# Patient Record
Sex: Male | Born: 2005 | Race: Black or African American | Hispanic: No | Marital: Single | Smoking: Never smoker
Health system: Southern US, Community
[De-identification: ages and names within clinical notes are randomized; demographics above are authoritative.]

## PROBLEM LIST (undated history)

## (undated) DIAGNOSIS — R5600 Simple febrile convulsions: Secondary | ICD-10-CM

## (undated) DIAGNOSIS — Z789 Other specified health status: Secondary | ICD-10-CM

## (undated) HISTORY — PX: ADENOIDECTOMY: SUR15

## (undated) HISTORY — PX: MYRINGOTOMY WITH TUBE PLACEMENT: SHX5663

## (undated) HISTORY — PX: TONSILLECTOMY: SUR1361

---
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
## 2024-01-01 ENCOUNTER — Ambulatory Visit (HOSPITAL_COMMUNITY): Payer: 59 | Admitting: Certified Registered"

## 2024-01-01 ENCOUNTER — Encounter (HOSPITAL_COMMUNITY): Payer: Self-pay | Admitting: Urology

## 2024-01-01 ENCOUNTER — Ambulatory Visit (HOSPITAL_BASED_OUTPATIENT_CLINIC_OR_DEPARTMENT_OTHER): Payer: 59 | Admitting: Certified Registered"

## 2024-01-01 ENCOUNTER — Ambulatory Visit (HOSPITAL_COMMUNITY)
Admission: RE | Admit: 2024-01-01 | Discharge: 2024-01-01 | Disposition: A | Discharge status: Home / Self Care | Payer: 59 | Source: Ambulatory Visit | Attending: Urology | Admitting: Urology

## 2024-01-01 ENCOUNTER — Encounter (HOSPITAL_COMMUNITY)
Admission: RE | Disposition: A | Discharge status: Home / Self Care | Payer: Self-pay | Source: Ambulatory Visit | Attending: Urology

## 2024-01-01 ENCOUNTER — Other Ambulatory Visit: Payer: Self-pay

## 2024-01-01 DIAGNOSIS — N4400 Torsion of testis, unspecified: Secondary | ICD-10-CM

## 2024-01-01 DIAGNOSIS — E66813 Obesity, class 3: Secondary | ICD-10-CM | POA: Diagnosis not present

## 2024-01-01 DIAGNOSIS — Z6838 Body mass index (BMI) 38.0-38.9, adult: Secondary | ICD-10-CM | POA: Diagnosis not present

## 2024-01-01 HISTORY — PX: ORCHIOPEXY: SHX479

## 2024-01-01 SURGERY — ORCHIOPEXY ADULT
Anesthesia: General | Site: Scrotum | Laterality: Bilateral

## 2024-01-01 MED ORDER — SUGAMMADEX SODIUM 200 MG/2ML IV SOLN
INTRAVENOUS | Status: DC | PRN
  Administered 2024-01-01: 280 mg via INTRAVENOUS

## 2024-01-01 MED ORDER — TRAMADOL HCL 50 MG PO TABS: 50.0000 mg | ORAL_TABLET | Freq: Four times a day (QID) | ORAL | 0 refills | Status: AC | PRN

## 2024-01-01 MED ORDER — LIDOCAINE HCL (PF) 2 % IJ SOLN
INTRAMUSCULAR | Status: AC
  Filled 2024-01-01: qty 5

## 2024-01-01 MED ORDER — MIDAZOLAM HCL 2 MG/2ML IJ SOLN
INTRAMUSCULAR | Status: AC
  Filled 2024-01-01: qty 2

## 2024-01-01 MED ORDER — LACTATED RINGERS IV SOLN: INTRAVENOUS | Status: DC

## 2024-01-01 MED ORDER — ORAL CARE MOUTH RINSE: 15.0000 mL | Freq: Once | OROMUCOSAL | Status: AC

## 2024-01-01 MED ORDER — LIDOCAINE 2% (20 MG/ML) 5 ML SYRINGE
INTRAMUSCULAR | Status: DC | PRN
  Administered 2024-01-01: 60 mg via INTRAVENOUS

## 2024-01-01 MED ORDER — FENTANYL CITRATE (PF) 100 MCG/2ML IJ SOLN
INTRAMUSCULAR | Status: AC
  Filled 2024-01-01: qty 2

## 2024-01-01 MED ORDER — LIDOCAINE HCL (PF) 2 % IJ SOLN
INTRAMUSCULAR | Status: AC
Start: 2024-01-01 — End: ?
  Filled 2024-01-01: qty 5

## 2024-01-01 MED ORDER — TRAMADOL HCL 50 MG PO TABS
ORAL_TABLET | ORAL | Status: AC
  Administered 2024-01-01: 50 mg via ORAL
  Filled 2024-01-01: qty 1

## 2024-01-01 MED ORDER — ACETAMINOPHEN 500 MG PO TABS
1000.0000 mg | ORAL_TABLET | Freq: Once | ORAL | Status: AC
  Administered 2024-01-01: 1000 mg via ORAL
  Filled 2024-01-01: qty 2

## 2024-01-01 MED ORDER — DROPERIDOL 2.5 MG/ML IJ SOLN: 0.6250 mg | Freq: Once | INTRAMUSCULAR | Status: DC | PRN

## 2024-01-01 MED ORDER — 0.9 % SODIUM CHLORIDE (POUR BTL) OPTIME
TOPICAL | Status: DC | PRN
  Administered 2024-01-01: 1000 mL

## 2024-01-01 MED ORDER — ONDANSETRON HCL 4 MG/2ML IJ SOLN
INTRAMUSCULAR | Status: DC | PRN
  Administered 2024-01-01: 4 mg via INTRAVENOUS

## 2024-01-01 MED ORDER — MIDAZOLAM HCL 2 MG/2ML IJ SOLN
INTRAMUSCULAR | Status: DC | PRN
  Administered 2024-01-01: 2 mg via INTRAVENOUS

## 2024-01-01 MED ORDER — DEXAMETHASONE SODIUM PHOSPHATE 10 MG/ML IJ SOLN
INTRAMUSCULAR | Status: DC | PRN
  Administered 2024-01-01: 10 mg via INTRAVENOUS

## 2024-01-01 MED ORDER — ROCURONIUM BROMIDE 10 MG/ML (PF) SYRINGE
PREFILLED_SYRINGE | INTRAVENOUS | Status: DC | PRN
  Administered 2024-01-01: 70 mg via INTRAVENOUS

## 2024-01-01 MED ORDER — SODIUM CHLORIDE 0.9 % IV SOLN
3.0000 g | INTRAVENOUS | Status: AC
  Administered 2024-01-01: 3 g via INTRAVENOUS
  Filled 2024-01-01: qty 3

## 2024-01-01 MED ORDER — MEPERIDINE HCL 50 MG/ML IJ SOLN: 6.2500 mg | INTRAMUSCULAR | Status: DC | PRN

## 2024-01-01 MED ORDER — TRAMADOL HCL 50 MG PO TABS: 50.0000 mg | ORAL_TABLET | Freq: Once | ORAL | Status: AC | PRN

## 2024-01-01 MED ORDER — BUPIVACAINE HCL 0.25 % IJ SOLN
INTRAMUSCULAR | Status: AC
  Filled 2024-01-01: qty 1

## 2024-01-01 MED ORDER — FENTANYL CITRATE (PF) 100 MCG/2ML IJ SOLN
INTRAMUSCULAR | Status: DC | PRN
  Administered 2024-01-01: 100 ug via INTRAVENOUS

## 2024-01-01 MED ORDER — PROPOFOL 10 MG/ML IV BOLUS
INTRAVENOUS | Status: DC | PRN
  Administered 2024-01-01: 300 mg via INTRAVENOUS

## 2024-01-01 MED ORDER — CHLORHEXIDINE GLUCONATE 0.12 % MT SOLN
15.0000 mL | Freq: Once | OROMUCOSAL | Status: AC
  Administered 2024-01-01: 15 mL via OROMUCOSAL

## 2024-01-01 MED ORDER — BUPIVACAINE HCL (PF) 0.25 % IJ SOLN
INTRAMUSCULAR | Status: DC | PRN
  Administered 2024-01-01: 10 mL

## 2024-01-01 MED ORDER — KETAMINE HCL 50 MG/5ML IJ SOSY
PREFILLED_SYRINGE | INTRAMUSCULAR | Status: AC
  Filled 2024-01-01: qty 5

## 2024-01-01 MED ORDER — KETAMINE HCL 10 MG/ML IJ SOLN
INTRAMUSCULAR | Status: DC | PRN
  Administered 2024-01-01: 20 mg via INTRAVENOUS
  Administered 2024-01-01: 10 mg via INTRAVENOUS

## 2024-01-01 MED ORDER — DEXAMETHASONE SODIUM PHOSPHATE 10 MG/ML IJ SOLN
INTRAMUSCULAR | Status: AC
  Filled 2024-01-01: qty 1

## 2024-01-01 MED ORDER — ONDANSETRON HCL 4 MG/2ML IJ SOLN
INTRAMUSCULAR | Status: AC
  Filled 2024-01-01: qty 2

## 2024-01-01 MED ORDER — PROPOFOL 10 MG/ML IV BOLUS
INTRAVENOUS | Status: AC
  Filled 2024-01-01: qty 20

## 2024-01-01 MED ORDER — HYDROMORPHONE HCL 1 MG/ML IJ SOLN: 0.2500 mg | INTRAMUSCULAR | Status: DC | PRN

## 2024-01-01 MED ORDER — ROCURONIUM BROMIDE 10 MG/ML (PF) SYRINGE
PREFILLED_SYRINGE | INTRAVENOUS | Status: AC
  Filled 2024-01-01: qty 10

## 2024-01-01 SURGICAL SUPPLY — 30 items
BAG COUNTER SPONGE SURGICOUNT (BAG) IMPLANT
BENZOIN TINCTURE PRP APPL 2/3 (GAUZE/BANDAGES/DRESSINGS) ×2 IMPLANT
BLADE HEX COATED 2.75 (ELECTRODE) ×2 IMPLANT
BLADE SURG 15 STRL LF DISP TIS (BLADE) ×2 IMPLANT
BNDG GAUZE DERMACEA FLUFF 4 (GAUZE/BANDAGES/DRESSINGS) ×2 IMPLANT
DERMABOND ADVANCED .7 DNX12 (GAUZE/BANDAGES/DRESSINGS) ×2 IMPLANT
DRAIN PENROSE 0.25X18 (DRAIN) ×2 IMPLANT
DRAIN PENROSE 0.5X18 (DRAIN) ×2 IMPLANT
DRAPE LAPAROTOMY T 98X78 PEDS (DRAPES) ×2 IMPLANT
ELECT REM PT RETURN 15FT ADLT (MISCELLANEOUS) ×2 IMPLANT
GAUZE SPONGE 4X4 12PLY STRL (GAUZE/BANDAGES/DRESSINGS) ×2 IMPLANT
GLOVE SURG LX STRL 7.5 STRW (GLOVE) ×2 IMPLANT
GOWN SRG XL LVL 4 BRTHBL STRL (GOWNS) ×2 IMPLANT
KIT BASIN OR (CUSTOM PROCEDURE TRAY) ×2 IMPLANT
KIT TURNOVER KIT A (KITS) IMPLANT
NDL HYPO 22X1.5 SAFETY MO (MISCELLANEOUS) IMPLANT
NEEDLE HYPO 22X1.5 SAFETY MO (MISCELLANEOUS) ×1 IMPLANT
NS IRRIG 1000ML POUR BTL (IV SOLUTION) IMPLANT
PACK BASIC VI WITH GOWN DISP (CUSTOM PROCEDURE TRAY) ×2 IMPLANT
PENCIL SMOKE EVACUATOR (MISCELLANEOUS) IMPLANT
SPONGE T-LAP 4X18 ~~LOC~~+RFID (SPONGE) ×4 IMPLANT
SUPPORTER AHLETIC TETRA LG (SOFTGOODS) ×2 IMPLANT
SUT MNCRL AB 4-0 PS2 18 (SUTURE) ×2 IMPLANT
SUT SILK 0 30XBRD TIE 6 (SUTURE) ×2 IMPLANT
SUT VIC AB 3-0 SH 27X BRD (SUTURE) IMPLANT
SUT VIC AB 3-0 SH 27XBRD (SUTURE) ×2 IMPLANT
SUT VIC AB 4-0 SH 27XBRD (SUTURE) IMPLANT
SYR 20ML LL LF (SYRINGE) ×2 IMPLANT
SYR CONTROL 10ML LL (SYRINGE) IMPLANT
WATER STERILE IRR 1000ML POUR (IV SOLUTION) IMPLANT

## 2024-01-01 NOTE — Op Note (Signed)
Preoperative diagnosis:  Bilateral intermittent testicular torsion  Postoperative diagnosis:  Same  Procedure: Bilateral orchidopexy  Surgeon: Crist Fat, MD  Anesthesia: General  Complications: None  Intraoperative findings:  #1: The patient's testicles were normal in appearance bilaterally.  There is no evidence of hydrocele or spermatocele.  EBL: Minimal  Specimens: None  Indication: Trevor Kim is a 18 y.o. patient with presented to the ER several times with testicular pain over the last several years.  This has been both sides.  Ultrasounds have essentially been normal.  After reviewing the management options for treatment, he elected to proceed with the above surgical procedure(s). We have discussed the potential benefits and risks of the procedure, side effects of the proposed treatment, the likelihood of the patient achieving the goals of the procedure, and any potential problems that might occur during the procedure or recuperation. Informed consent has been obtained.  Description of procedure:   Consent was obtained the preoperative holding area.  He was brought back to the operating room placed on table in supine position.  General esthesia then induced endotracheal tube was inserted.  His genitals were prepped and draped in the routine sterile fashion.  Time was subsequently performed.  I instilled 10 cc of quarter percent Marcaine into the scrotal raphae and made approximately 3 cm incision through the skin.  I then went through the dartos layers on to the right testicle.  I subsequently opened up the tunica layer and expose the testicle.  After thorough inspection it was noted to be completely normal.  I pexied to testicle on the lateral posterior and medial aspect with 4-0 Vicryl.  I then placed the back into the scrotum and closed the dartos layers over top with a 3-0 Vicryl.  I subsequently repeated this process on the patient's left side in a very similar  fashion pexing the testicle and 3 locations, at the base of the testicle medial and lateral.  I then closed the tunica layers over top.  I put a deep dermal layer of 3-0 Vicryl.  I then closed the skin with a 4-0 Monocryl in a vertical mattress running stitch.  I reinjected the incision with additional 10 cc of quarter percent Marcaine.  I applied Dermabond to the incision.  We then unfurled a 4 inch Kerlix and fluffed it and placed it around the patient scrotum and then put mesh underpants on.  The patient was subsequently extubated and returned the PACU in stable condition.  Disposition: The patient be discharged home, has follow-up in 2 weeks.

## 2024-01-01 NOTE — Anesthesia Procedure Notes (Signed)
Procedure Name: Intubation Date/Time: 01/01/2024 11:30 AM  Performed by: Sindy Guadeloupe, CRNAPre-anesthesia Checklist: Patient identified, Emergency Drugs available, Suction available and Patient being monitored Patient Re-evaluated:Patient Re-evaluated prior to induction Oxygen Delivery Method: Circle system utilized Preoxygenation: Pre-oxygenation with 100% oxygen Induction Type: IV induction Ventilation: Two handed mask ventilation required and Oral airway inserted - appropriate to patient size Laryngoscope Size: Glidescope and 4 Grade View: Grade I Tube type: Oral Tube size: 7.5 mm Number of attempts: 1 Airway Equipment and Method: Stylet and Oral airway Placement Confirmation: ETT inserted through vocal cords under direct vision, positive ETCO2 and breath sounds checked- equal and bilateral Secured at: 23 cm Tube secured with: Tape Dental Injury: Teeth and Oropharynx as per pre-operative assessment

## 2024-01-01 NOTE — Transfer of Care (Signed)
Immediate Anesthesia Transfer of Care Note  Patient: Trevor Kim  Procedure(s) Performed: BILATERAL ORCHIOPEXY ADULT (Bilateral: Scrotum)  Patient Location: PACU  Anesthesia Type:General  Level of Consciousness: awake, drowsy, and patient cooperative  Airway & Oxygen Therapy: Patient Spontanous Breathing and Patient connected to face mask oxygen  Post-op Assessment: Report given to RN and Post -op Vital signs reviewed and stable  Post vital signs: Reviewed and stable  Last Vitals:  Vitals Value Taken Time  BP 144/91 01/01/24 1254  Temp    Pulse 93 01/01/24 1255  Resp 16 01/01/24 1255  SpO2 98 % 01/01/24 1255  Vitals shown include unfiled device data.  Last Pain:  Vitals:   01/01/24 0931  TempSrc:   PainSc: 0-No pain         Complications: No notable events documented.

## 2024-01-01 NOTE — Discharge Instructions (Signed)
Discharge instructions following scrotal surgery  Call your doctor for: Fever is greater than 100.5 Severe nausea or vomiting Increasing pain not controlled by pain medication Increasing redness or drainage from incisions  The number for questions or concerns is [phone]  Activity level: No lifting greater than 10 pounds (about equal to milk) for the next 2 weeks or until cleared to do so at follow-up appointment.  Otherwise activity as tolerated by comfort level.  Diet: May resume your regular diet as tolerated  Driving: No driving while still taking opiate pain medications (weight at least 6-8 hours after last dose).  No driving if you still sore from surgery as it may limit her ability to react quickly if necessary.   Shower/bath: May shower and get incision wet pad dry immediately following.  Do not scrub vigorously for the next 2-3 weeks.  Do not soak incision (ID soaking in bath or swimming) until told he may do so by Dr., as this may promote a wound infection.  Wound care: He may cover wounds with sterile gauze as needed to prevent incisions rubbing on close follow-up in any seepage.  Where tight fitting underpants for at least 2 weeks.  He should apply cold compresses (ice or sac of frozen peas/corn) to your scrotum for at least 48 hours to reduce the swelling.  You should expect that his scrotum will swell up initially and then get smaller over the next 2-4 weeks.  Follow-up appointments: Follow-up appointment will be scheduled with Dr. Marlou Porch or [facility] provider in 2 weeks for a wound check.

## 2024-01-01 NOTE — Interval H&P Note (Signed)
History and Physical Interval Note:  01/01/2024 10:37 AM  Trevor Kim  has presented today for surgery, with the diagnosis of Testicular Torsion.  The various methods of treatment have been discussed with the patient and family. After consideration of risks, benefits and other options for treatment, the patient has consented to  Procedure(s) with comments: BILATERAL ORCHIOPEXY ADULT (Bilateral) - 75 MINUTE CASE as a surgical intervention.  The patient's history has been reviewed, patient examined, no change in status, stable for surgery.  I have reviewed the patient's chart and labs.  Questions were answered to the patient's satisfaction.     Crist Fat

## 2024-01-01 NOTE — Anesthesia Preprocedure Evaluation (Addendum)
Anesthesia Evaluation  Patient identified by MRN, date of birth, ID band Patient awake    Reviewed: Allergy & Precautions, H&P , NPO status , Patient's Chart, lab work & pertinent test results  Airway Mallampati: III  TM Distance: >3 FB Neck ROM: Full    Dental no notable dental hx. (+) Dental Advisory Given, Teeth Intact   Pulmonary neg pulmonary ROS   Pulmonary exam normal breath sounds clear to auscultation       Cardiovascular negative cardio ROS Normal cardiovascular exam Rhythm:Regular Rate:Normal     Neuro/Psych Seizures -, Well Controlled,   negative psych ROS   GI/Hepatic negative GI ROS, Neg liver ROS,,,  Endo/Other    Class 3 obesity  Renal/GU negative Renal ROS     Musculoskeletal   Abdominal  (+) + obese  Peds  Hematology negative hematology ROS (+)   Anesthesia Other Findings   Reproductive/Obstetrics                             Anesthesia Physical Anesthesia Plan  ASA: 2  Anesthesia Plan: General   Post-op Pain Management: Tylenol PO (pre-op)*   Induction: Intravenous  PONV Risk Score and Plan: 2 and Ondansetron, Dexamethasone, Midazolam and Treatment may vary due to age or medical condition  Airway Management Planned: LMA  Additional Equipment:   Intra-op Plan:   Post-operative Plan: Extubation in OR  Informed Consent: I have reviewed the patients History and Physical, chart, labs and discussed the procedure including the risks, benefits and alternatives for the proposed anesthesia with the patient or authorized representative who has indicated his/her understanding and acceptance.     Dental advisory given  Plan Discussed with: CRNA  Anesthesia Plan Comments:         Anesthesia Quick Evaluation

## 2024-01-01 NOTE — H&P (Signed)
#Testicular Pain  10/30/2023: Patient is a pleasant 18 year old male with no pertinent past medical history that presents after being seen in the ED on 10/17/2003 with acute onset left testicular pain.  Patient states that prior to presenting to the ED, he was sitting and noticed acute onset parable left testicular pain. He presented to the emergency department, and underwent a scrotal ultrasound. He states that the pain was partially relieved by the time that they were performing the ultrasound. The ultrasound ultimately showed hyperemia of the left testicle, but was otherwise unremarkable.  Patient states that this episode not happened before. However, since then, he has had intermittent more mild episodes of right and left testicular discomfort, but is unsure if it is the same feeling as when he went to the emergency department  He has no prior urologic history. He is not had any genital trauma, any new recent sexual contacts, or history of kidney stones. He has no lower urinary tract symptoms, denies hematuria.  Patient has no past medical history, but reports having a seizure once.  Past surgical history includes tonsillectomy and adenoidectomy in 2007, and appendectomy in 2021.  He has no cardiac history, and does not take any medications.    11/21/2023: Discussion about proceeding bilateral orchidopexy held at last visit as it was presumed the patient had left testicular torsion that spontaneously resolved. For financial/insurance reasons, he wanted to wait until January. He was back in the emergency department 1 day later on the 15th with right testicular pain. Scrotal ultrasound showed no recurrence of/evidence of torsion but the right testicle did have some increased hyperemia consistent with orchitis. He was treated conservatively with supportive measures and anti-inflammatories. Now returns today accompanied by his mother for follow-up evaluation. He is asymptomatic at this time. His previously  endorsed right testicular pain quickly resolved. He denies any interval concerns, new or worsening LUTS including absence of any dysuria or gross hematuria. No interval fevers or chills, nausea/vomiting     ALLERGIES: No Allergies    MEDICATIONS: No Medications    GU PSH: No GU PSH    NON-GU PSH: Appendectomy (laparoscopic) - 2021     GU PMH: Testicular torsion - 10/30/2023    NON-GU PMH: No Non-GU PMH    FAMILY HISTORY: No Family History    SOCIAL HISTORY: Marital Status: Single    REVIEW OF SYSTEMS:    GU Review Male:   Patient denies frequent urination, hard to postpone urination, burning/ pain with urination, get up at night to urinate, leakage of urine, stream starts and stops, trouble starting your stream, have to strain to urinate , erection problems, and penile pain.  Gastrointestinal (Upper):   Patient denies nausea, vomiting, and indigestion/ heartburn.  Gastrointestinal (Lower):   Patient denies diarrhea and constipation.  Constitutional:   Patient denies fever, night sweats, weight loss, and fatigue.  Skin:   Patient denies skin rash/ lesion and itching.  Eyes:   Patient denies blurred vision and double vision.  Ears/ Nose/ Throat:   Patient denies sore throat and sinus problems.  Hematologic/Lymphatic:   Patient denies swollen glands and easy bruising.  Cardiovascular:   Patient denies chest pains and leg swelling.  Respiratory:   Patient denies cough and shortness of breath.  Endocrine:   Patient denies excessive thirst.  Musculoskeletal:   Patient denies back pain and joint pain.  Neurological:   Patient denies headaches and dizziness.  Psychologic:   Patient denies depression and anxiety.   VITAL SIGNS:  11/21/2023 09:19 AM  Weight 300 lb / 136.08 kg  Height 74 in / 187.96 cm  BP 116/67 mmHg  Pulse 68 /min  Temperature 97.7 F / 36.5 C  BMI 38.5 kg/m   GU PHYSICAL EXAMINATION:    Scrotum: No lesions. No edema. No cysts. No warts.  Epididymides:  Right: no spermatocele, no masses, no cysts, no tenderness, no induration, no enlargement. Left: no spermatocele, no masses, no cysts, no tenderness, no induration, no enlargement.  Testes: No tenderness, no swelling, no enlargement left testes. No tenderness, no swelling, no enlargement right testes. Normal location left testes. Normal location right testes. No mass, no cyst, no varicocele, no hydrocele left testes. No mass, no cyst, no varicocele, no hydrocele right testes.  Urethral Meatus: Normal size. No lesion, no wart, no discharge, no polyp. Normal location.  Penis: Circumcised, no warts, no cracks. No dorsal Peyronie's plaques, no left corporal Peyronie's plaques, no right corporal Peyronie's plaques, no scarring, no warts. No balanitis, no meatal stenosis.   MULTI-SYSTEM PHYSICAL EXAMINATION:    Constitutional: Well-nourished. No physical deformities. Normally developed. Good grooming.  Neck: Neck symmetrical, not swollen. Normal tracheal position.  Respiratory: No labored breathing, no use of accessory muscles.   Cardiovascular: Normal temperature, normal extremity pulses, no swelling, no varicosities.  Skin: No paleness, no jaundice, no cyanosis. No lesion, no ulcer, no rash.  Neurologic / Psychiatric: Oriented to time, oriented to place, oriented to person. No depression, no anxiety, no agitation.  Gastrointestinal: No hernia. No mass, no tenderness, no rigidity, non obese abdomen.   Musculoskeletal: Normal gait and station of head and neck.     Complexity of Data:  Source Of History:  Patient, Family/Caregiver, Medical Record Summary  Records Review:   Previous Doctor Records, Previous Hospital Records, Previous Patient Records  Urine Test Review:   Urinalysis  X-Ray Review: Scrotal Ultrasound: Reviewed Films. Discussed With Patient.     11/21/23  Urinalysis  Urine Appearance Clear   Urine Color Yellow   Urine Glucose Neg mg/dL  Urine Bilirubin Neg mg/dL  Urine Ketones Neg  mg/dL  Urine Specific Gravity 1.020   Urine Blood Neg ery/uL  Urine pH 5.5   Urine Protein Trace mg/dL  Urine Urobilinogen 0.2 mg/dL  Urine Nitrites Neg   Urine Leukocyte Esterase Neg leu/uL   PROCEDURES:          Urinalysis Dipstick Dipstick Cont'd  Color: Yellow Bilirubin: Neg mg/dL  Appearance: Clear Ketones: Neg mg/dL  Specific Gravity: 4.540 Blood: Neg ery/uL  pH: 5.5 Protein: Trace mg/dL  Glucose: Neg mg/dL Urobilinogen: 0.2 mg/dL    Nitrites: Neg    Leukocyte Esterase: Neg leu/uL    ASSESSMENT:      ICD-10 Details  1 GU:   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 We discussed proceeding with bilateral orchidopexy to prevent future recurrence of testicular torsion in similar fashion to prior discussion at last visit. Risk versus benefits as well as expected PERI and postoperative course discussed in detail. All questions answered to the best of my ability with understanding expressed by the patient. He  elects to proceed. He is going to have better insurance coverage in January so we will tentatively start planning for the procedure to occur then. In the meantime I recommended continued use of good scrotal supportive underwear, proper body mechanics with heavy lifting and other strenuous activity. Appropriate return to clinic instructions discussed in detail (any interval recurrence of testicular pain, he understands to present to the nearest emergency department for evaluation.

## 2024-01-02 ENCOUNTER — Encounter (HOSPITAL_COMMUNITY): Payer: Self-pay | Admitting: Urology

## 2024-01-02 NOTE — Anesthesia Postprocedure Evaluation (Signed)
Anesthesia Post Note  Patient: Trevor Kim  Procedure(s) Performed: BILATERAL ORCHIOPEXY ADULT (Bilateral: Scrotum)     Patient location during evaluation: PACU Anesthesia Type: General Level of consciousness: sedated and patient cooperative Pain management: pain level controlled Vital Signs Assessment: post-procedure vital signs reviewed and stable Respiratory status: spontaneous breathing Cardiovascular status: stable Anesthetic complications: no   No notable events documented.  Last Vitals:  Vitals:   01/01/24 1426 01/01/24 1430  BP: (!) 149/91 (!) 143/87  Pulse: 90 84  Resp: 20   Temp: 36.9 C   SpO2: 97% 99%    Last Pain:  Vitals:   01/01/24 1426  TempSrc: Oral  PainSc: 5                  Lewie Loron
